# Patient Record
Sex: Male | Born: 1975 | Race: White | Hispanic: No | Marital: Single | Smoking: Never smoker
Health system: Southern US, Community
[De-identification: ages and names within clinical notes are randomized; demographics above are authoritative.]

## PROBLEM LIST (undated history)

## (undated) DIAGNOSIS — G4700 Insomnia, unspecified: Secondary | ICD-10-CM

## (undated) DIAGNOSIS — F419 Anxiety disorder, unspecified: Secondary | ICD-10-CM

## (undated) HISTORY — DX: Anxiety disorder, unspecified: F41.9

## (undated) HISTORY — DX: Insomnia, unspecified: G47.00

## (undated) HISTORY — PX: OTHER SURGICAL HISTORY: SHX169

---

## 2014-12-04 ENCOUNTER — Ambulatory Visit: Payer: Self-pay | Admitting: Family Medicine

## 2014-12-04 LAB — CBC AND DIFFERENTIAL
HCT: 47 % (ref 41–53)
HEMOGLOBIN: 16 g/dL (ref 13.5–17.5)
Platelets: 173 10*3/uL (ref 150–399)
WBC: 5 10^3/mL

## 2014-12-04 LAB — BASIC METABOLIC PANEL
BUN: 13 mg/dL (ref 4–21)
Creatinine: 1 mg/dL (ref 0.6–1.3)
Glucose: 107 mg/dL
Potassium: 4.4 mmol/L (ref 3.4–5.3)
SODIUM: 144 mmol/L (ref 137–147)

## 2014-12-04 LAB — HEPATIC FUNCTION PANEL
ALT: 18 U/L (ref 10–40)
AST: 14 U/L (ref 14–40)

## 2014-12-04 LAB — LIPID PANEL
Cholesterol: 202 mg/dL — AB (ref 0–200)
HDL: 70 mg/dL (ref 35–70)
LDL CALC: 121 mg/dL
TRIGLYCERIDES: 55 mg/dL (ref 40–160)

## 2015-11-04 DIAGNOSIS — G4700 Insomnia, unspecified: Secondary | ICD-10-CM | POA: Insufficient documentation

## 2015-11-04 DIAGNOSIS — F439 Reaction to severe stress, unspecified: Secondary | ICD-10-CM | POA: Insufficient documentation

## 2015-11-04 DIAGNOSIS — F419 Anxiety disorder, unspecified: Secondary | ICD-10-CM | POA: Insufficient documentation

## 2015-11-05 ENCOUNTER — Encounter: Payer: Self-pay | Admitting: Family Medicine

## 2015-11-05 ENCOUNTER — Ambulatory Visit (INDEPENDENT_AMBULATORY_CARE_PROVIDER_SITE_OTHER): Payer: No Typology Code available for payment source | Admitting: Family Medicine

## 2015-11-05 VITALS — BP 104/60 | HR 81 | Temp 98.2°F | Resp 16 | Wt 158.0 lb

## 2015-11-05 DIAGNOSIS — R1011 Right upper quadrant pain: Secondary | ICD-10-CM | POA: Insufficient documentation

## 2015-11-05 LAB — POCT URINALYSIS DIPSTICK
GLUCOSE UA: NEGATIVE
Ketones, UA: NEGATIVE
Leukocytes, UA: NEGATIVE
Nitrite, UA: NEGATIVE
Protein, UA: NEGATIVE
RBC UA: NEGATIVE
SPEC GRAV UA: 1.02
Urobilinogen, UA: 0.2
pH, UA: 6

## 2015-11-05 NOTE — Progress Notes (Signed)
Patient: Douglas Yang Male    DOB: 1976/02/21   40 y.o.   MRN: [id number] Visit Date: 11/05/2015  Today's Provider: Lelon Huh, MD   Chief Complaint  Patient presents with  . Abdominal Pain  . Back Pain   Subjective:    Abdominal Pain This is a recurrent problem. The current episode started more than 1 month ago (1 month). The onset quality is gradual. The problem occurs intermittently. The problem has been gradually improving. The pain is located in the RUQ and right flank. The pain is at a severity of 5/10. The pain is moderate. The quality of the pain is aching (presssure ). The abdominal pain does not radiate. Associated symptoms include belching, flatus, melena and myalgias. Pertinent negatives include no anorexia, arthralgias, constipation, diarrhea, dysuria, fever, frequency, headaches, hematochezia, hematuria, nausea, vomiting or weight loss. Associated symptoms comments: Ran a fever last week. The pain is aggravated by eating. The pain is relieved by palpation (rest). He has tried antacids (zantac) for the symptoms. The treatment provided mild relief.    Upper right quadrant pain, pressure, discomfort for the last month. Low back pain on right side. Patient was seen in office 11/2014 for similar symptoms. He had normal CBC and Met C. He had abdominal Xr showing mild excess of stool. Pain spontaneously resolved and did not come back until about 6 weeks ago. It feels better as soon as he lays down at night. No nausea. It is a steady pain.    No Known Allergies Previous Medications   GLUCOSAMINE HCL (GLUCOSAMINE PO)    Take by mouth daily.   RANITIDINE HCL (ZANTAC PO)    Take by mouth daily.    Review of Systems  Constitutional: Negative for fever, chills, weight loss and appetite change.  Respiratory: Negative for chest tightness, shortness of breath and wheezing.   Cardiovascular: Negative for palpitations.  Gastrointestinal: Positive for abdominal pain,  melena and flatus. Negative for nausea, vomiting, diarrhea, constipation, hematochezia and anorexia.  Genitourinary: Negative for dysuria, frequency and hematuria.  Musculoskeletal: Positive for myalgias and back pain. Negative for arthralgias.  Neurological: Negative for headaches.    Social History  Substance Use Topics  . Smoking status: Never Smoker   . Smokeless tobacco: Not on file  . Alcohol Use: 0.0 oz/week    0 Standard drinks or equivalent per week     Comment: occasional use   Objective:   BP 104/60 mmHg  Pulse 81  Temp(Src) 98.2 F (36.8 C) (Oral)  Resp 16  Wt 158 lb (71.668 kg)  SpO2 99%  Physical Exam  General Appearance:    Alert, cooperative, no distress  Eyes:    PERRL, conjunctiva/corneas clear, EOM's intact       Lungs:     Clear to auscultation bilaterally, respirations unlabored  Heart:    Regular rate and rhythm  Abdomen:   bowel sounds present and normal in all 4 quadrants, soft, round, non-distended. Slight RUQ tenderness. Negative Carnett's sign. No CVA tenderness   Results for orders placed or performed in visit on 11/05/15  POCT urinalysis dipstick  Result Value Ref Range   Color, UA Yellow    Clarity, UA Clear    Glucose, UA Neg    Bilirubin, UA Small    Ketones, UA Neg    Spec Grav, UA 1.020    Blood, UA Neg    pH, UA 6.0    Protein, UA Neg    Urobilinogen,  UA 0.2    Nitrite, UA Neg    Leukocytes, UA Negative Negative        Assessment & Plan:     1. Right upper quadrant pain Recurrent. Normal CBC and met C last year. Obtain ultrasound, if negative consider CT - US Abdomen Limited RUQ; Future - POCT urinalysis dipstick       Lelon Huh, MD  [facility]

## 2015-11-08 ENCOUNTER — Telehealth: Payer: Self-pay | Admitting: Family Medicine

## 2015-11-08 ENCOUNTER — Ambulatory Visit
Admission: RE | Admit: 2015-11-08 | Discharge: 2015-11-08 | Disposition: A | Discharge status: Home / Self Care | Payer: No Typology Code available for payment source | Source: Ambulatory Visit | Attending: Family Medicine | Admitting: Family Medicine

## 2015-11-08 DIAGNOSIS — R1011 Right upper quadrant pain: Secondary | ICD-10-CM | POA: Diagnosis not present

## 2015-11-08 DIAGNOSIS — R109 Unspecified abdominal pain: Secondary | ICD-10-CM

## 2015-11-08 NOTE — Telephone Encounter (Signed)
-----   Message from Malva Limes, MD sent at 11/08/2015 10:14 AM EST ----- Ultrasound is completely normal. Need to proceed with CT abdomen with contrast  for persistent abdominal pain.

## 2015-11-08 NOTE — Telephone Encounter (Signed)
Pt stated he was returning a nurse call. Thanks TNP °

## 2015-11-08 NOTE — Telephone Encounter (Signed)
Please schedule CT abdominal scan. Thanks!

## 2015-11-13 ENCOUNTER — Ambulatory Visit
Admission: RE | Admit: 2015-11-13 | Discharge: 2015-11-13 | Disposition: A | Discharge status: Home / Self Care | Payer: No Typology Code available for payment source | Source: Ambulatory Visit | Attending: Family Medicine | Admitting: Family Medicine

## 2015-11-13 DIAGNOSIS — R109 Unspecified abdominal pain: Secondary | ICD-10-CM | POA: Diagnosis present

## 2015-11-13 DIAGNOSIS — K760 Fatty (change of) liver, not elsewhere classified: Secondary | ICD-10-CM | POA: Insufficient documentation

## 2015-11-13 DIAGNOSIS — D734 Cyst of spleen: Secondary | ICD-10-CM | POA: Insufficient documentation

## 2015-11-13 MED ORDER — IOHEXOL 300 MG/ML  SOLN
100.0000 mL | Freq: Once | INTRAMUSCULAR | Status: AC | PRN
  Administered 2015-11-13: 100 mL via INTRAVENOUS

## 2015-11-14 ENCOUNTER — Telehealth: Payer: Self-pay | Admitting: *Deleted

## 2015-11-14 DIAGNOSIS — R1011 Right upper quadrant pain: Secondary | ICD-10-CM

## 2015-11-14 NOTE — Telephone Encounter (Signed)
Patient was notified of results. Patient expressed understanding. Patient is still having discomfort. Referral for gastro entered.

## 2015-11-14 NOTE — Telephone Encounter (Signed)
-----   Message from Malva Limes, MD sent at 11/14/2015  8:42 AM EST ----- CT scan shows mild fatty infiltration of liver, which is not a health concern and should not be causing any pain. If pain is still there then I would recommend referral to a gastroenterologist for further evaluation.

## 2015-11-14 NOTE — Telephone Encounter (Signed)
Please schedule gastro appt. Thanks!

## 2016-05-22 ENCOUNTER — Ambulatory Visit: Payer: Self-pay | Admitting: Family Medicine

## 2017-05-13 ENCOUNTER — Ambulatory Visit (INDEPENDENT_AMBULATORY_CARE_PROVIDER_SITE_OTHER): Payer: 59 | Admitting: Family Medicine

## 2017-05-13 ENCOUNTER — Encounter: Payer: Self-pay | Admitting: Family Medicine

## 2017-05-13 VITALS — BP 124/90 | HR 86 | Temp 98.2°F | Resp 16 | Wt 154.2 lb

## 2017-05-13 DIAGNOSIS — B349 Viral infection, unspecified: Secondary | ICD-10-CM | POA: Diagnosis not present

## 2017-05-13 NOTE — Patient Instructions (Signed)
We will call you with the lab results. 

## 2017-05-13 NOTE — Progress Notes (Signed)
Subjective:     Patient ID: Douglas Yang, male   DOB: 04-22-1976, 41 y.o.   MRN: [id number]  HPI  Chief Complaint  Patient presents with  . Lymphadenopathy    Patient comes in office today with concerns of swelling of his right lymph node for over two weeks. Patient states that he has had intermittent symptoms of sore throat, fatigue, sneezing and had har more of a mucuous production. Patient reports coworker had similar symptoms, he states that he has taken Ibuprofen for pain.   Rleports intermittent low grade viral sx for approx.two weeks. No fever, chills, or sinus congestion. Denies significant hx of allergies. Reports co-worker had similar sx. Admits to some fatigue and stress as he is trying to prepare his home for sale. Currently works in a [employer] dept.   Review of Systems     Objective:   Physical Exam  Constitutional: He appears well-developed and well-nourished. No distress.  Ears: T.M's intact without inflammation Throat: no tonsillar enlargement or exudate Neck: small left anterior cervical node present Lungs: clear     Assessment:    1. Nonspecific syndrome suggestive of viral illness - CBC with Differential/Platelet - Epstein-Barr virus VCA antibody panel    Plan:    Further f/u pending lab work.

## 2017-05-14 ENCOUNTER — Telehealth: Payer: Self-pay

## 2017-05-14 NOTE — Telephone Encounter (Signed)
Patient advised.KW 

## 2017-05-14 NOTE — Telephone Encounter (Signed)
-----   Message from Anola Gurneyobert Chauvin, [place] sent at 05/14/2017  7:47 AM EDT ----- Blood count is ok with no anemia or white count elevation suggesting a serious infection. We will call you when the mono antibody results come in-probably next week.

## 2017-05-15 LAB — EPSTEIN-BARR VIRUS VCA ANTIBODY PANEL
EBV EARLY ANTIGEN AB, IGG: 45.6 U/mL — AB (ref 0.0–8.9)
EBV NA IGG: 171 U/mL — AB (ref 0.0–17.9)
EBV VCA IgG: 287 U/mL — ABNORMAL HIGH (ref 0.0–17.9)

## 2017-05-15 LAB — CBC WITH DIFFERENTIAL/PLATELET
Basophils Absolute: 0 10*3/uL (ref 0.0–0.2)
Basos: 1 %
EOS (ABSOLUTE): 0.1 10*3/uL (ref 0.0–0.4)
Eos: 2 %
HEMOGLOBIN: 15.2 g/dL (ref 13.0–17.7)
Hematocrit: 44.5 % (ref 37.5–51.0)
IMMATURE GRANS (ABS): 0 10*3/uL (ref 0.0–0.1)
Immature Granulocytes: 0 %
LYMPHS: 27 %
Lymphocytes Absolute: 1.5 10*3/uL (ref 0.7–3.1)
MCH: 29.4 pg (ref 26.6–33.0)
MCHC: 34.2 g/dL (ref 31.5–35.7)
MCV: 86 fL (ref 79–97)
MONOCYTES: 5 %
Monocytes Absolute: 0.3 10*3/uL (ref 0.1–0.9)
NEUTROS ABS: 3.5 10*3/uL (ref 1.4–7.0)
Neutrophils: 65 %
PLATELETS: 179 10*3/uL (ref 150–379)
RBC: 5.17 x10E6/uL (ref 4.14–5.80)
RDW: 14.2 % (ref 12.3–15.4)
WBC: 5.3 10*3/uL (ref 3.4–10.8)

## 2017-05-18 ENCOUNTER — Telehealth: Payer: Self-pay

## 2017-05-18 NOTE — Telephone Encounter (Signed)
-----   Message from Anola Gurneyobert Chauvin, [place] sent at 05/18/2017  7:42 AM EDT ----- Your mono labs suggest that you have been exposed to mono in the past or are convalescing from a recent infection which is what I suspect. Try to increase rest and minimize stress if possible. Your symptoms should improve over the next two weeks or so.

## 2017-05-18 NOTE — Telephone Encounter (Signed)
Pt advised.  He reports he started feeling a little better over the weekend.   Thanks,   -Douglas RiegerLaura

## 2017-11-03 ENCOUNTER — Ambulatory Visit (INDEPENDENT_AMBULATORY_CARE_PROVIDER_SITE_OTHER): Payer: 59 | Admitting: Family Medicine

## 2017-11-03 ENCOUNTER — Encounter: Payer: Self-pay | Admitting: Family Medicine

## 2017-11-03 VITALS — BP 130/80 | HR 74 | Temp 98.7°F | Resp 16 | Wt 168.0 lb

## 2017-11-03 DIAGNOSIS — R05 Cough: Secondary | ICD-10-CM | POA: Diagnosis not present

## 2017-11-03 DIAGNOSIS — J069 Acute upper respiratory infection, unspecified: Secondary | ICD-10-CM

## 2017-11-03 DIAGNOSIS — R059 Cough, unspecified: Secondary | ICD-10-CM

## 2017-11-03 MED ORDER — HYDROCODONE-HOMATROPINE 5-1.5 MG/5ML PO SYRP: 5.0000 mL | ORAL_SOLUTION | Freq: Three times a day (TID) | ORAL | 0 refills | Status: AC | PRN

## 2017-11-03 MED ORDER — FLUTICASONE PROPIONATE 50 MCG/ACT NA SUSP: 2.0000 | Freq: Every day | NASAL | 6 refills | Status: AC

## 2017-11-03 NOTE — Progress Notes (Signed)
       Patient: Douglas Yang Male    DOB: 02/20/1976   42 y.o.   MRN: [id number] Visit Date: 11/03/2017  Today's Provider: Mila Merryonald Fisher, MD   Chief Complaint  Patient presents with  . Cough   Subjective:    Patient has had a dry cough and nasal congestion for 1 week. Symptoms include: irritated throat, non-productive cough, sinus pressure. Also has eye irritation, eye drainage, and photophobia. Patient has been taking otc Theraflu and delsym with mild relief.      Cough  This is a new problem. The current episode started in the past 7 days. The problem has been unchanged. The problem occurs constantly. The cough is non-productive. Associated symptoms include ear congestion, eye redness, nasal congestion, postnasal drip, rhinorrhea and a sore throat. Pertinent negatives include no chest pain, chills, ear pain, fever, headaches, heartburn, hemoptysis, myalgias, rash, shortness of breath, sweats, weight loss or wheezing. Nothing aggravates the symptoms. The treatment provided mild relief.       No Known Allergies   Current Outpatient Medications:  Marland Kitchen.  Glucosamine HCl (GLUCOSAMINE PO), Take by mouth daily., Disp: , Rfl:   Review of Systems  Constitutional: Negative for appetite change, chills, fever and weight loss.  HENT: Positive for congestion, postnasal drip, rhinorrhea, sinus pressure and sore throat. Negative for ear pain and sinus pain.   Eyes: Positive for photophobia, discharge, redness and itching.  Respiratory: Positive for cough. Negative for hemoptysis, chest tightness, shortness of breath and wheezing.   Cardiovascular: Negative for chest pain and palpitations.  Gastrointestinal: Negative for abdominal pain, heartburn, nausea and vomiting.  Musculoskeletal: Negative for myalgias.  Skin: Negative for rash.  Neurological: Negative for headaches.    Social History   Tobacco Use  . Smoking status: Never Smoker  . Smokeless tobacco: Never Used  Substance  Use Topics  . Alcohol use: Yes    Alcohol/week: 0.0 oz    Comment: occasional use   Objective:   BP 130/80 (BP Location: Right Arm, Patient Position: Sitting, Cuff Size: Normal)   Pulse 74   Temp 98.7 F (37.1 C) (Oral)   Resp 16   Wt 168 lb (76.2 kg)   SpO2 98%   BMI 23.43 kg/m     Physical Exam  General Appearance:    Alert, cooperative, no distress  HENT:   generalized TM normal without fluid or infection, neck without nodes, throat normal without erythema or exudate, sinuses nontender and nasal mucosa congested  Eyes:    PERRL, conjunctiva/corneas clear, EOM's intact       Lungs:     Clear to auscultation bilaterally, respirations unlabored  Heart:    Regular rate and rhythm  Neurologic:   Awake, alert, oriented x 3. No apparent focal neurological           defect.           Assessment & Plan:     1. Upper respiratory tract infection, unspecified type  - fluticasone (FLONASE) 50 MCG/ACT nasal spray; Place 2 sprays into both nostrils daily.  Dispense: 16 g; Refill: 6  2. Cough  - HYDROcodone-homatropine (HYCODAN) 5-1.5 MG/5ML syrup; Take 5 mLs by mouth every 8 (eight) hours as needed.  Dispense: 100 mL; Refill: 0  Call if symptoms change or if not rapidly improving.          Mila Merryonald Fisher, MD  [facility]

## 2017-11-27 IMAGING — CT CT ABD-PELV W/ CM
1 of 2 series · 15 of 32 positions shown, 19 images · IV contrast (APPLIED)
Comparison: None.

CLINICAL DATA: Recurrent right-sided abdominal pain for over 2
months.

EXAM:
CT ABDOMEN AND PELVIS WITH CONTRAST
TECHNIQUE: Multidetector CT imaging of the abdomen and pelvis was performed
using the standard protocol following bolus administration of
intravenous contrast.
CONTRAST:  100mL OMNIPAQUE IOHEXOL 300 MG/ML  SOLN

[Series 2: axial st · axial · 0.63mm/px · z∈[-1071,-656]mm · 15 of 91 slices shown, 19 images]
[im 4/91  soft-tissue]
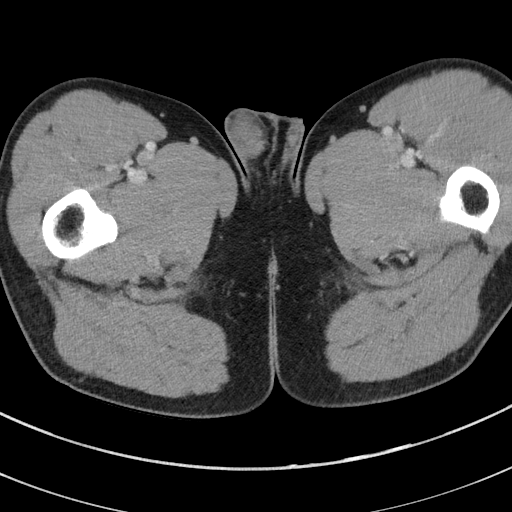
[im 4/91  bone]
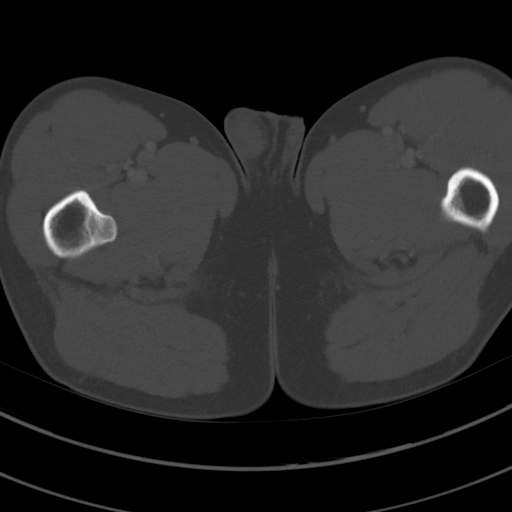
[im 11/91  soft-tissue]
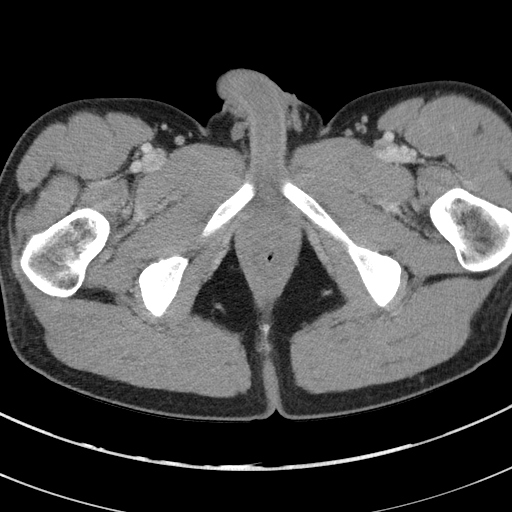
[im 19/91  soft-tissue]
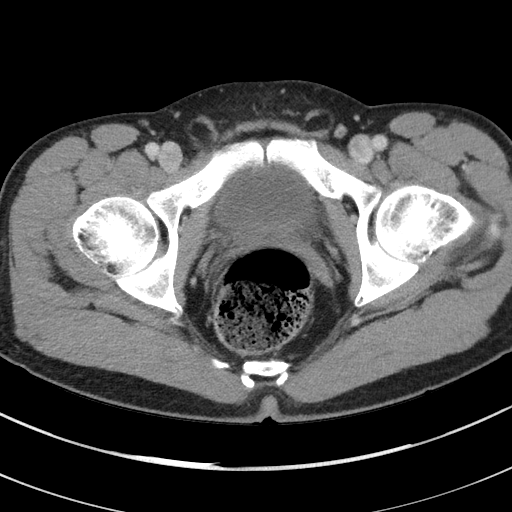
[im 26/91  soft-tissue]
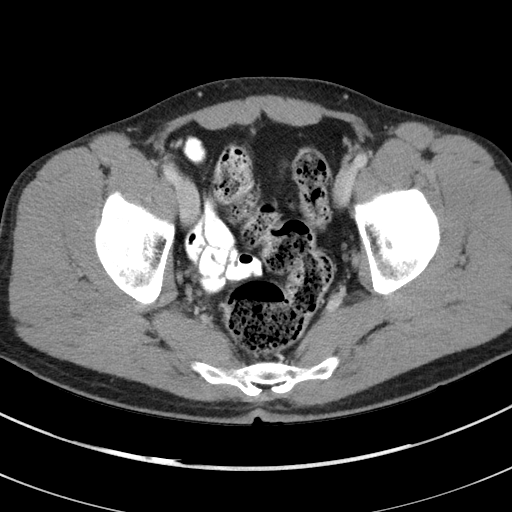
[im 33/91  soft-tissue]
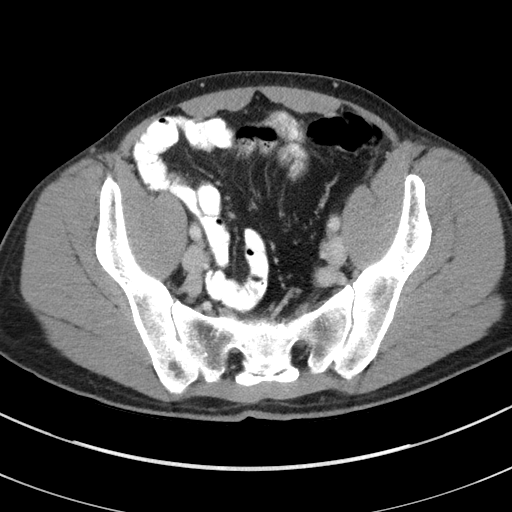
[im 40/91  soft-tissue]
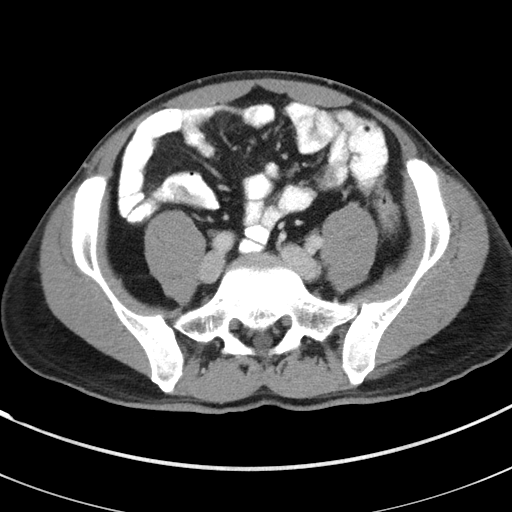
[im 47/91  soft-tissue]
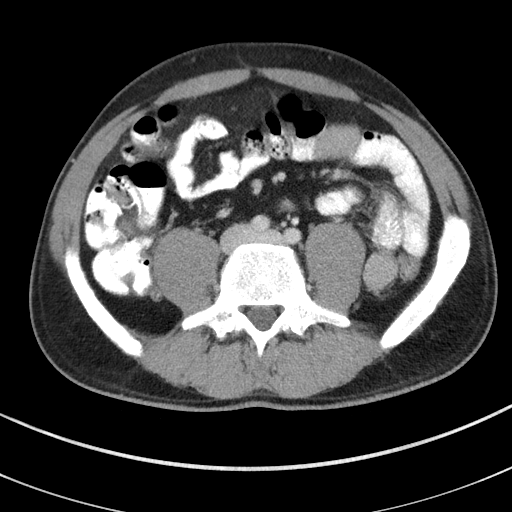
[im 51/91  soft-tissue]
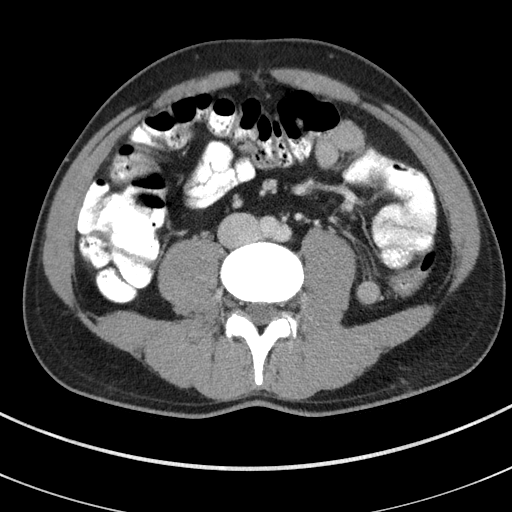
[im 58/91  soft-tissue]
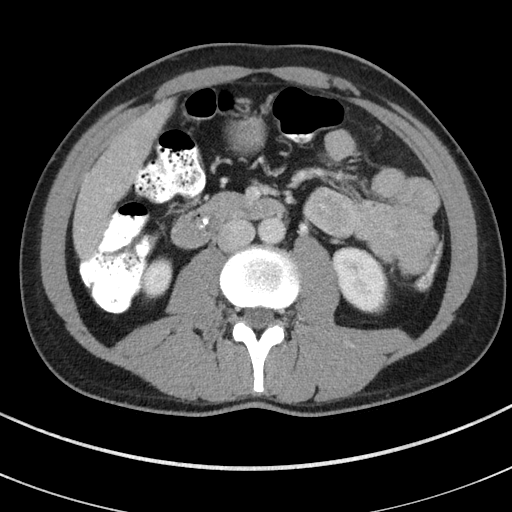
[im 58/91  bone]
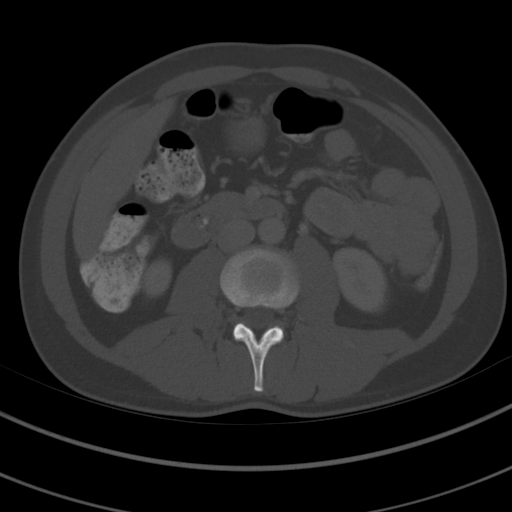
[im 65/91  soft-tissue]
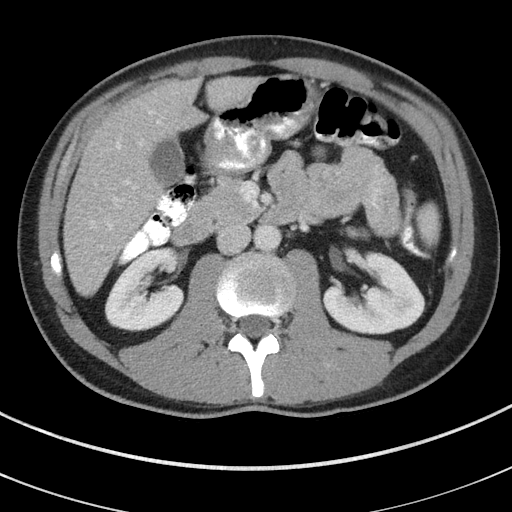
[im 73/91  soft-tissue]
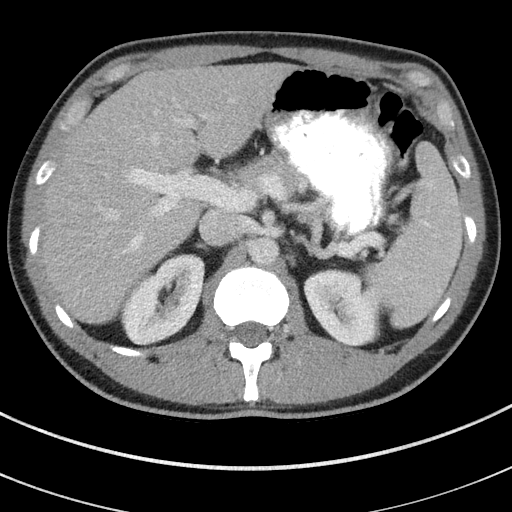
[im 76/91  lung]
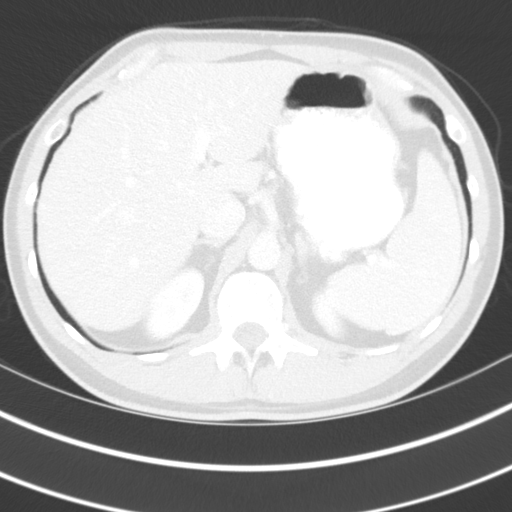
[im 80/91  soft-tissue]
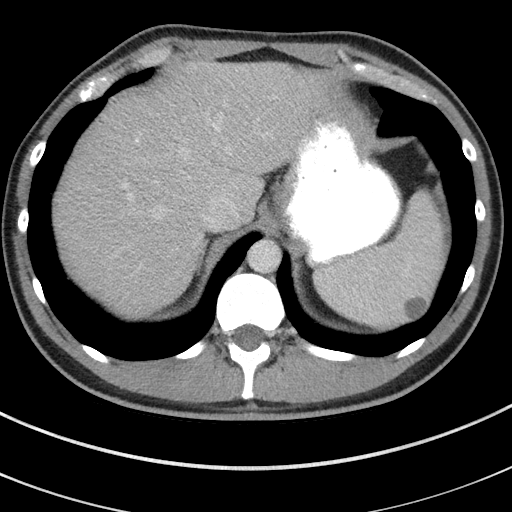
[im 80/91  lung]
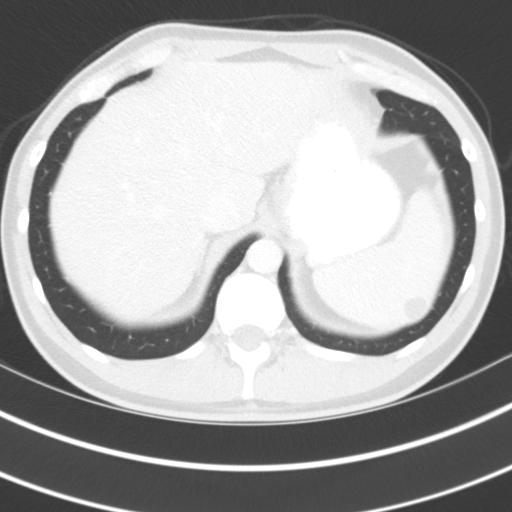
[im 83/91  lung]
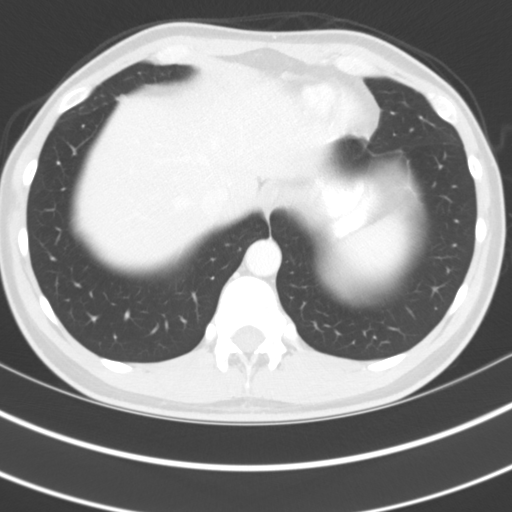
[im 87/91  soft-tissue]
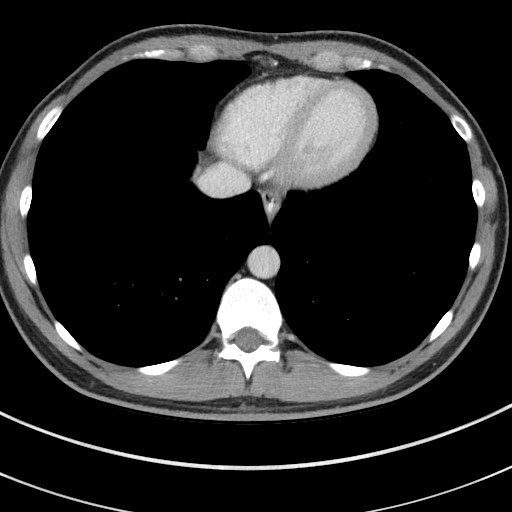
[im 87/91  lung]
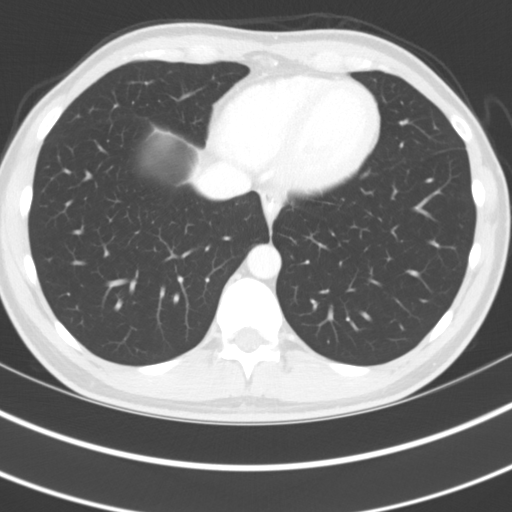

[15 of 32 positions shown; findings below may reference images not displayed]

FINDINGS: Lower chest:  No acute findings.

Hepatobiliary: Mild diffuse hepatic steatosis noted. No liver masses
are identified. Gallbladder is unremarkable. No evidence of biliary
ductal dilatation.

Pancreas: No mass, inflammatory changes, or other significant
abnormality.

Spleen: No evidence of splenomegaly. Benign-appearing cyst noted
within the spleen measuring 1.5 cm.

Adrenals/Urinary Tract: No masses identified. No evidence of
hydronephrosis.

Stomach/Bowel: No evidence of obstruction, inflammatory process, or
abnormal fluid collections. Normal appendix visualized.

Vascular/Lymphatic: No pathologically enlarged lymph nodes. No
evidence of abdominal aortic aneurysm.

Reproductive: No mass or other significant abnormality.

Other: None.

Musculoskeletal:  No suspicious bone lesions identified.
IMPRESSION: No acute findings identified within the abdomen or pelvis.

Incidental findings including mild hepatic steatosis and small
benign-appearing splenic cyst.

## 2018-08-17 ENCOUNTER — Ambulatory Visit (INDEPENDENT_AMBULATORY_CARE_PROVIDER_SITE_OTHER): Payer: 59 | Admitting: Family Medicine

## 2018-08-17 ENCOUNTER — Encounter: Payer: Self-pay | Admitting: Family Medicine

## 2018-08-17 VITALS — BP 124/82 | HR 89 | Temp 99.1°F | Resp 16 | Wt 166.0 lb

## 2018-08-17 DIAGNOSIS — R509 Fever, unspecified: Secondary | ICD-10-CM | POA: Diagnosis not present

## 2018-08-17 DIAGNOSIS — B349 Viral infection, unspecified: Secondary | ICD-10-CM

## 2018-08-17 LAB — POCT INFLUENZA A/B
INFLUENZA B, POC: NEGATIVE
Influenza A, POC: NEGATIVE

## 2018-08-17 NOTE — Progress Notes (Signed)
  Subjective:     Patient ID: Douglas Yang, male   DOB: Jan 08, 1976, 42 y.o.   MRN: [id number] Chief Complaint  Patient presents with  . URI    Patient c omes in office today with concerns of cold like symptoms for the past 4 days. Patient reports sore throat, cough, nasal/chest congestion and a fever for the past 12hrs high of 101.7. Patient states that he has been alternating between Tylenol and Ibuprofen.    HPI States he has a 903 month old at home who is not ill. Girl friend has had the flu shot.                                       Review of Systems     Objective:   Physical Exam  Constitutional: He appears well-developed and well-nourished. He has a sickly appearance. No distress.  Ears: Right TM obscured by cerumen. Left TM intact without inflammation Throat: no tonsillar enlargement or exudate Neck: no cervical adenopathy Lungs: clear     Assessment:    1. Fever, unspecified fever cause - POCT Influenza A/B  2. Acute viral syndrome    Plan:    Discussed otc medication and infection control practices. Work excuse for 12/4-12/6/19.

## 2018-08-17 NOTE — Patient Instructions (Signed)
Discussed use of Mucinex D for congestion, Delsym for cough, and Benadryl for postnasal drainage 

## 2018-09-02 ENCOUNTER — Telehealth: Payer: Self-pay | Admitting: Family Medicine

## 2018-09-02 MED ORDER — AMOXICILLIN 500 MG PO CAPS: 1000.0000 mg | ORAL_CAPSULE | Freq: Three times a day (TID) | ORAL | 0 refills | Status: AC

## 2018-09-02 NOTE — Telephone Encounter (Signed)
Pt came in Dec 4th and wasn't given an antibiotic and was told to take over the counter medications. Pt is not over the cough and congestion in his chest.  He is wanting to know if something can be called in for him since he has been seen.  He doesn't want to miss anymore work.  Please advise.  Thanks, [employer]

## 2018-09-02 NOTE — Telephone Encounter (Signed)
Have sent prescription for [facility].

## 2018-09-02 NOTE — Telephone Encounter (Signed)
Advised patient as below.  

## 2019-12-23 ENCOUNTER — Ambulatory Visit: Payer: Self-pay | Attending: Internal Medicine

## 2019-12-23 DIAGNOSIS — Z23 Encounter for immunization: Secondary | ICD-10-CM

## 2019-12-23 NOTE — Progress Notes (Signed)
   Covid-19 Vaccination Clinic  Name:  Douglas Yang    MRN: [id number] DOB: [birth year]  12/23/2019  Douglas Yang was observed post Covid-19 immunization for 15 minutes without incident. He was provided with Vaccine Information Sheet and instruction to access the V-Safe system.   Douglas Yang was instructed to call 911 with any severe reactions post vaccine: Marland Kitchen Difficulty breathing  . Swelling of face and throat  . A fast heartbeat  . A bad rash all over body  . Dizziness and weakness   Immunizations Administered    Name Date Dose VIS Date Route   Pfizer COVID-19 Vaccine 12/23/2019  9:36 AM 0.3 mL 08/25/2019 Intramuscular   Manufacturer: [employer], [employer]   Lot: [id number]   NDC: [address]-6

## 2020-01-16 ENCOUNTER — Ambulatory Visit: Payer: Self-pay | Attending: Internal Medicine

## 2020-01-16 DIAGNOSIS — Z23 Encounter for immunization: Secondary | ICD-10-CM

## 2020-01-16 NOTE — Progress Notes (Signed)
   Covid-19 Vaccination Clinic  Name:  [name]    MRN: [id number] DOB: 01/17/1976  01/16/2020  Mr. Cho was observed post Covid-19 immunization for 15 minutes without incident. He was provided with Vaccine Information Sheet and instruction to access the V-Safe system.   Mr. Lonon was instructed to call 911 with any severe reactions post vaccine: Marland Kitchen Difficulty breathing  . Swelling of face and throat  . A fast heartbeat  . A bad rash all over body  . Dizziness and weakness   Immunizations Administered    Name Date Dose VIS Date Route   Pfizer COVID-19 Vaccine 01/16/2020 12:17 PM 0.3 mL 11/08/2018 Intramuscular   Manufacturer: [employer], [employer]   Lot: [id number]   NDC: [address]-3

## 2020-11-21 ENCOUNTER — Telehealth (INDEPENDENT_AMBULATORY_CARE_PROVIDER_SITE_OTHER): Payer: 59 | Admitting: Physician Assistant

## 2020-11-21 DIAGNOSIS — R0981 Nasal congestion: Secondary | ICD-10-CM | POA: Diagnosis not present

## 2020-11-21 MED ORDER — AMOXICILLIN 875 MG PO TABS: 875.0000 mg | ORAL_TABLET | Freq: Two times a day (BID) | ORAL | 0 refills | Status: AC

## 2020-11-21 NOTE — Progress Notes (Signed)
MyChart Video Visit    Virtual Visit via Video Note   This visit type was conducted due to national recommendations for restrictions regarding the COVID-19 Pandemic (e.g. social distancing) in an effort to limit this patient's exposure and mitigate transmission in our community. This patient is at least at moderate risk for complications without adequate follow up. This format is felt to be most appropriate for this patient at this time. Physical exam was limited by quality of the video and audio technology used for the visit.   Patient location: Home Provider location: Office   I discussed the limitations of evaluation and management by telemedicine and the availability of in person appointments. The patient expressed understanding and agreed to proceed.  Patient: Douglas Yang   DOB: 04/09/1976   45 y.o. Male  MRN: [id number] Visit Date: 11/21/2020  Today's healthcare provider: Trey Sailors, PA-C   Chief Complaint  Patient presents with  . URI  I,Jurnee Nakayama M Coriann Brouhard,acting as a scribe for [employer], PA-C.,have documented all relevant documentation on the behalf of Trey Sailors, PA-C,as directed by  Trey Sailors, PA-C while in the presence of Trey Sailors, PA-C.  Subjective    URI  This is a recurrent problem. The current episode started 1 to 4 weeks ago. The problem has been unchanged. There has been no fever. Associated symptoms include congestion, coughing, ear pain, rhinorrhea, sinus pain, sneezing and a sore throat. Pertinent negatives include no diarrhea, headaches, nausea, vomiting or wheezing. He has tried decongestant, acetaminophen and NSAIDs for the symptoms. The treatment provided mild relief.    Patient reports sinus pressure and congestion for 3-4 weeks, ear pain. Initially when he got sick he got COVID tested. Three to four weeks ago he started to feel congestion. He reports he has persistent congestion since then that has waxed and waned  to some extent. Some days he will have more nasal congestion and other days he will have a sore throat. No SOB. He has not tried anything for his symptoms recently. The first 1-2 weeks he took a decongestant several times at night to help him sleep. He has taken an ibuprofen to help reduce inflammation. Has not taken anything for the past couple of weeks. He reports he has congestion that concentrates on his right side.     Medications: Outpatient Medications Prior to Visit  Medication Sig  . Glucosamine HCl (GLUCOSAMINE PO) Take by mouth daily.  . fluticasone (FLONASE) 50 MCG/ACT nasal spray Place 2 sprays into both nostrils daily. (Patient not taking: No sig reported)   No facility-administered medications prior to visit.    Review of Systems  Constitutional: Negative for appetite change, chills, fatigue and fever.  HENT: Positive for congestion, ear pain, postnasal drip, rhinorrhea, sinus pressure, sinus pain, sneezing and sore throat.   Respiratory: Positive for cough. Negative for chest tightness, shortness of breath and wheezing.   Gastrointestinal: Negative for diarrhea, nausea and vomiting.  Neurological: Negative for weakness and headaches.      Objective    There were no vitals taken for this visit.   Physical Exam Constitutional:      Appearance: Normal appearance.  Pulmonary:     Effort: Pulmonary effort is normal. No respiratory distress.  Neurological:     Mental Status: He is alert.  Psychiatric:        Mood and Affect: Mood normal.        Behavior: Behavior normal.  Assessment & Plan    1. Sinus congestion  Discussed with patient that persistence of symptoms for 3-4 weeks and waxing/waning nature are most consistent with allergies. Advised to take daily 2nd gen antihistamine and flonase for at least one week before using an antibiotic as I do not think this is a bacterial sinus infection. May use antibiotic if not impoving.   - amoxicillin (AMOXIL)  875 MG tablet; Take 1 tablet (875 mg total) by mouth 2 (two) times daily for 7 days.  Dispense: 14 tablet; Refill: 0    Return if symptoms worsen or fail to improve.     I discussed the assessment and treatment plan with the patient. The patient was provided an opportunity to ask questions and all were answered. The patient agreed with the plan and demonstrated an understanding of the instructions.   The patient was advised to call back or seek an in-person evaluation if the symptoms worsen or if the condition fails to improve as anticipated.   ITrey Sailors, PA-C, have reviewed all documentation for this visit. The documentation on 11/21/20 for the exam, diagnosis, procedures, and orders are all accurate and complete.  The entirety of the information documented in the History of Present Illness, Review of Systems and Physical Exam were personally obtained by me. Portions of this information were initially documented by [facility] and reviewed by me for thoroughness and accuracy.    Maryella Shivers [facility] [phone] (phone) [phone] (fax)  [facility]

## 2020-11-21 NOTE — Patient Instructions (Signed)
Allergies, Adult An allergy means that your body reacts to something that bothers it (allergen). This can happen from something that you eat, breathe in, or touch. Allergies often affect the nose, eyes, skin, and stomach. They can be mild, moderate, or very bad (severe). An allergy cannot spread from person to person. They can happen at any age. Sometimes, people outgrow them. What are the causes?  Outdoor things, such as pollen, car fumes, and mold.  Indoor things, such as dust, smoke, mold, and pets.  Foods.  Medicines.  Things that bother your skin, such as perfume and bug bites. What increases the risk?  Having family members with allergies or asthma. What are the signs or symptoms? Symptoms depend on how bad your allergy is. Mild to moderate symptoms  Runny nose, stuffy nose, or sneezing.  Itchy mouth, ears, or throat.  A feeling of mucus dripping down the back of your throat.  Sore throat.  Eyes that are itchy, red, watery, or puffy.  A skin rash, or red, swollen areas of skin (hives).  Stomach cramps or bloating. Severe symptoms Very bad allergies to food, medicine, or bug bites may cause a very bad allergy reaction (anaphylaxis). This can be life-threatening. Symptoms include:  A red face.  Wheezing or coughing.  Swollen lips, tongue, or mouth.  Tight or swollen throat.  Chest pain or tightness, or a fast heartbeat.  Trouble breathing or shortness of breath.  Pain in your belly (abdomen), vomiting, or watery poop (diarrhea).  Feeling dizzy or fainting. How is this treated? Treatment for this condition depends on your symptoms. Treatment may include:  Cold, wet cloths for itching and swelling.  Eye drops, nose sprays, or skin creams.  Washing out your nose each day.  A humidifier.  Medicines.  A change to the foods you eat.  Being exposed again and again to tiny amounts of allergens. This helps your body get used to them. You might  have: ? Allergy shots. ? Very small amounts of allergen put under your tongue.  An emergency shot (auto-injector pen) if you have a very bad allergy reaction. ? This is a medicine with a needle. You can put it into your skin by yourself. ? Your doctor will teach you how to use it.      Follow these instructions at home: Medicines  Take or apply over-the-counter and prescription medicines only as told by your doctor.  If you are at risk for a very bad allergy reaction, keep an auto-injector pen with you all the time.   Eating and drinking  Follow instructions from your doctor about what to eat and drink.  Drink enough fluid to keep your pee (urine) pale yellow. General instructions  If you have ever had a very bad allergy reaction, wear a medical alert bracelet or necklace.  Stay away from things that you are allergic to.  Keep all follow-up visits as told by your doctor. This is important. Contact a doctor if:  Your symptoms do not get better with treatment. Get help right away if:  You have symptoms of a very bad allergy reaction. These include: ? A swollen mouth, tongue, or throat. ? Pain or tightness in your chest. ? Trouble breathing. ? Being short of breath. ? Dizziness. ? Fainting. ? Very bad pain in your belly. ? Vomiting. ? Watery poop. These symptoms may be an emergency. Do not wait to see if the symptoms will go away. Get medical help right away. Call your local   emergency services (911 in the [place] Do not drive yourself to the hospital. Summary  Take or apply over-the-counter and prescription medicines only as told by your doctor.  Stay away from things you are allergic to.  If you are at risk for a very bad allergy reaction, carry an auto-injector pen all the time.  Wear a medical alert bracelet or necklace.  Very bad allergy reactions can be life-threatening. Get help right away. This information is not intended to replace advice given to you by your  health care provider. Make sure you discuss any questions you have with your health care provider. Document Revised: 07/12/2019 Document Reviewed: 07/12/2019 Elsevier Patient Education  2021 Elsevier Inc.  

## 2021-07-24 ENCOUNTER — Ambulatory Visit: Payer: Self-pay | Admitting: *Deleted

## 2021-07-24 NOTE — Telephone Encounter (Signed)
Unfortunately, needs to wait for appointment, ive never seen him and hes not a regular here

## 2021-07-24 NOTE — Telephone Encounter (Signed)
Requesting Tamiflu instead of waiting for appointment tomorrow. Daughters positive for Type A flu yesterday. Today, he wakes up with ST, fatigue and mild body aches, no fever and does not want to wait until 1st available appointment tomorrow (already scheduled).Care advice including increase water intake for hydration and OTC pain relief like tylenol.  [facility] [address] Routing to physician for review.    Reason for Disposition  Caller has medicine question, adult has minor symptoms, caller declines triage, AND triager answers question  Answer Assessment - Initial Assessment Questions 1. NAME of MEDICATION: "What medicine are you calling about?"     Tamiflu 2. QUESTION: "What is your question?" (e.g., double dose of medicine, side effect)     Requesting Tamiflu 3. PRESCRIBING HCP: "Who prescribed it?" Reason: if prescribed by specialist, call should be referred to that group.     na 4. SYMPTOMS: "Do you have any symptoms?"     ST, fatigue, body aches 5. SEVERITY: If symptoms are present, ask "Are they mild, moderate or severe?"     Mild today 6. PREGNANCY:  "Is there any chance that you are pregnant?" "When was your last menstrual period?"     na  Protocols used: Medication Question Call-A-AH

## 2021-07-25 ENCOUNTER — Encounter: Payer: Self-pay | Admitting: Family Medicine

## 2021-07-25 ENCOUNTER — Other Ambulatory Visit: Payer: Self-pay

## 2021-07-25 ENCOUNTER — Ambulatory Visit (INDEPENDENT_AMBULATORY_CARE_PROVIDER_SITE_OTHER): Payer: 59 | Admitting: Family Medicine

## 2021-07-25 VITALS — BP 121/80 | HR 93 | Temp 98.9°F | Resp 16 | Wt 163.0 lb

## 2021-07-25 DIAGNOSIS — Z20828 Contact with and (suspected) exposure to other viral communicable diseases: Secondary | ICD-10-CM

## 2021-07-25 DIAGNOSIS — J069 Acute upper respiratory infection, unspecified: Secondary | ICD-10-CM | POA: Diagnosis not present

## 2021-07-25 MED ORDER — OSELTAMIVIR PHOSPHATE 75 MG PO CAPS: 75.0000 mg | ORAL_CAPSULE | Freq: Two times a day (BID) | ORAL | 0 refills | Status: AC

## 2021-07-25 NOTE — Progress Notes (Signed)
I,April Miller,acting as a scribe for Shirlee Latch, MD.,have documented all relevant documentation on the behalf of Shirlee Latch, MD,as directed by  Shirlee Latch, MD while in the presence of Shirlee Latch, MD.   Established patient visit   Patient: Douglas Yang   DOB: [birth year]   45 y.o. Male  MRN: [id number] Visit Date: 07/25/2021  Today's healthcare provider: Shirlee Latch, MD   Chief Complaint  Patient presents with   Cough   Sore Throat   Subjective    Cough This is a new problem. The current episode started yesterday. The problem has been gradually worsening. The problem occurs constantly. The cough is Non-productive. Associated symptoms include chills, a fever, nasal congestion, rhinorrhea and a sore throat. Pertinent negatives include no chest pain, ear congestion, ear pain, headaches, heartburn, hemoptysis, myalgias, postnasal drip, rash, shortness of breath, sweats, weight loss or wheezing. Nothing aggravates the symptoms. Treatments tried: Ibuprofen. The treatment provided mild relief.    Patient has been exposed to flu this week by his daughter. Patient began having symptoms yesterday morning. Patient has cough, congestion, scratchy throat, and low-grade fever (100), malaise, myalgias. Patient has been taking ibuprofen for symptoms, with mild relief.  X1d Hasn't had a flu vaccine this year.    Medications: Outpatient Medications Prior to Visit  Medication Sig   fluticasone (FLONASE) 50 MCG/ACT nasal spray Place 2 sprays into both nostrils daily. (Patient not taking: No sig reported)   Glucosamine HCl (GLUCOSAMINE PO) Take by mouth daily.   No facility-administered medications prior to visit.    Review of Systems  Constitutional:  Positive for chills and fever. Negative for appetite change and weight loss.  HENT:  Positive for congestion, rhinorrhea and sore throat. Negative for ear pain and postnasal drip.   Respiratory:   Positive for cough. Negative for hemoptysis, chest tightness, shortness of breath and wheezing.   Cardiovascular:  Negative for chest pain and palpitations.  Gastrointestinal:  Negative for abdominal pain, heartburn, nausea and vomiting.  Musculoskeletal:  Negative for myalgias.  Skin:  Negative for rash.  Neurological:  Negative for headaches.      Objective    BP 121/80 (BP Location: Right Arm, Patient Position: Sitting, Cuff Size: Normal)   Pulse 93   Temp 98.9 F (37.2 C) (Temporal)   Resp 16   Wt 163 lb (73.9 kg)   SpO2 96%   BMI 22.73 kg/m  {Show previous vital signs (optional):23777}  Physical Exam Vitals reviewed.  Constitutional:      General: He is not in acute distress.    Appearance: Normal appearance. He is not diaphoretic.  HENT:     Head: Normocephalic and atraumatic.  Eyes:     General: No scleral icterus.    Conjunctiva/sclera: Conjunctivae normal.  Cardiovascular:     Rate and Rhythm: Normal rate and regular rhythm.     Pulses: Normal pulses.     Heart sounds: Normal heart sounds. No murmur heard. Pulmonary:     Effort: Pulmonary effort is normal. No respiratory distress.     Breath sounds: Normal breath sounds. No wheezing or rhonchi.  Abdominal:     General: There is no distension.     Palpations: Abdomen is soft.     Tenderness: There is no abdominal tenderness.  Musculoskeletal:     Cervical back: Neck supple.     Right lower leg: No edema.     Left lower leg: No edema.  Lymphadenopathy:     Cervical:  No cervical adenopathy.  Skin:    General: Skin is warm and dry.     Capillary Refill: Capillary refill takes less than 2 seconds.     Findings: No rash.  Neurological:     Mental Status: He is alert and oriented to person, place, and time.     Cranial Nerves: No cranial nerve deficit.  Psychiatric:        Mood and Affect: Mood normal.        Behavior: Behavior normal.      No results found for any visits on 07/25/21.  Assessment & Plan      1. Viral URI with cough 2. Exposure to the flu - symptoms and exam c/w viral URI - no evidence of strep pharyngitis, CAP, AOM, bacterial sinusitis, or other bacterial infection - concern for flu given known exposure - will start tamiflu, but send test for flu and COVID - additional treatment pending results - discussed symptomatic management, natural course, and return precautions  - Coronavirus (COVID-19) with Influenza A and Influenza B  Meds ordered this encounter  Medications   oseltamivir (TAMIFLU) 75 MG capsule    Sig: Take 1 capsule (75 mg total) by mouth 2 (two) times daily for 5 days.    Dispense:  10 capsule    Refill:  0     Return if symptoms worsen or fail to improve.      I, Shirlee Latch, MD, have reviewed all documentation for this visit. The documentation on 07/25/21 for the exam, diagnosis, procedures, and orders are all accurate and complete.   Tayt Moyers, Marzella Schlein, MD, MPH [facility]

## 2021-07-27 LAB — SPECIMEN STATUS REPORT

## 2021-07-27 LAB — COVID-19, FLU A+B NAA
Influenza A, NAA: DETECTED — AB
Influenza B, NAA: NOT DETECTED
SARS-CoV-2, NAA: NOT DETECTED

## 2021-09-11 ENCOUNTER — Ambulatory Visit: Payer: Self-pay | Admitting: *Deleted

## 2021-09-11 NOTE — Telephone Encounter (Signed)
Pt called saying he thinks he has a sinus infection and wants to be seen asap.   CB#  [phone]    Chief Complaint: Sinus drainage Symptoms: Greenish drainage "Hacked up", slight frontal headache, stuffy nose then runny "Tan, green when blow nose, one streak of blood."  Frequency: 1 1/2 weeks ago Pertinent Negatives: Patient denies fever, sob, wheezing,sore throat Disposition: [] ED /[] Urgent Care (no appt availability in office) / [] Appointment(In office/virtual)/ [x]  [facility]/ [] Home Care/ [] Refused Recommended Disposition  Additional Notes:     Reason for Disposition  [1] Sinus congestion (pressure, fullness) AND [2] present > 10 days  Answer Assessment - Initial Assessment Questions 1. LOCATION: "Where does it hurt?"      Mostly in nose and sinus cavities 2. ONSET: "When did the sinus pain start?"  (e.g., hours, days)      1 1/2 weeks ago 3. SEVERITY: "How bad is the pain?"   (Scale 1-10; mild, moderate or severe)   - MILD (1-3): doesn't interfere with normal activities    - MODERATE (4-7): interferes with normal activities (e.g., work or school) or awakens from sleep   - SEVERE (8-10): excruciating pain and patient unable to do any normal activities        mild 4. RECURRENT SYMPTOM: "Have you ever had sinus problems before?" If Yes, ask: "When was the last time?" and "What happened that time?"      yes 5. NASAL CONGESTION: "Is the nose blocked?" If Yes, ask: "Can you open it or must you breathe through your mouth?"     At times 6. NASAL DISCHARGE: "Do you have discharge from your nose?" If so ask, "What color?"     Greenish,tan, slight tinge of blood 7. FEVER: "Do you have a fever?" If Yes, ask: "What is it, how was it measured, and when did it start?"      no 8. OTHER SYMPTOMS: "Do you have any other symptoms?" (e.g., sore throat, cough, earache, difficulty breathing)     Mild sore throat from drainage. Mild cough, dry. Drainage down throat  Protocols  used: Sinus Pain or Congestion-A-AH

## 2022-06-19 ENCOUNTER — Ambulatory Visit: Payer: Self-pay | Admitting: *Deleted

## 2022-06-19 ENCOUNTER — Encounter: Payer: Self-pay | Admitting: Family Medicine

## 2022-06-19 ENCOUNTER — Ambulatory Visit: Payer: 59 | Admitting: Family Medicine

## 2022-06-19 VITALS — BP 137/101 | HR 85 | Temp 98.7°F | Ht 70.98 in | Wt 166.0 lb

## 2022-06-19 DIAGNOSIS — J40 Bronchitis, not specified as acute or chronic: Secondary | ICD-10-CM | POA: Diagnosis not present

## 2022-06-19 DIAGNOSIS — R059 Cough, unspecified: Secondary | ICD-10-CM | POA: Diagnosis not present

## 2022-06-19 DIAGNOSIS — R062 Wheezing: Secondary | ICD-10-CM

## 2022-06-19 MED ORDER — HYDROCODONE BIT-HOMATROP MBR 5-1.5 MG/5ML PO SOLN: 5.0000 mL | Freq: Three times a day (TID) | ORAL | 0 refills | Status: DC | PRN

## 2022-06-19 MED ORDER — ALBUTEROL SULFATE HFA 108 (90 BASE) MCG/ACT IN AERS: 2.0000 | INHALATION_SPRAY | Freq: Four times a day (QID) | RESPIRATORY_TRACT | 2 refills | Status: AC | PRN

## 2022-06-19 MED ORDER — AZITHROMYCIN 250 MG PO TABS: ORAL_TABLET | ORAL | 0 refills | Status: AC

## 2022-06-19 NOTE — Progress Notes (Signed)
Established patient visit   Patient: Douglas Yang   DOB: May 22, 1976   46 y.o. Male  MRN: [id number] Visit Date: 06/19/2022  Today's healthcare provider: Lelon Huh, MD   Chief Complaint  Patient presents with   Cough   Subjective    Cough This is a new problem. Episode onset: 1 week ago. The problem has been gradually improving (still has cough). Associated symptoms include a fever (resolved), myalgias and wheezing. Pertinent negatives include no chest pain, chills or shortness of breath. Treatments tried: Ibuprofen, Acetaminophen, Delsym and left over prescription cough medication. The treatment provided mild relief.  Had fevers up to 101 and body aches 2-3 into illness which lasted 2-3 days had have resolved. Coving has been productive clear and tan mucous the last couple of days. Gets a little winded with exertion.  Patient reports having a negative home COVID test 2 days ago. Having more head and sinus congestion the last day or so.   Medications: Outpatient Medications Prior to Visit  Medication Sig   fluticasone (FLONASE) 50 MCG/ACT nasal spray Place 2 sprays into both nostrils daily. (Patient not taking: Reported on 06/19/2022)   Glucosamine HCl (GLUCOSAMINE PO) Take by mouth daily. (Patient not taking: Reported on 06/19/2022)   No facility-administered medications prior to visit.    Review of Systems  Constitutional:  Positive for fever (resolved). Negative for appetite change and chills.  HENT:  Positive for congestion (chest and head congestion).   Respiratory:  Positive for cough (productive with tan colored sputum) and wheezing. Negative for chest tightness and shortness of breath.   Cardiovascular:  Negative for chest pain and palpitations.  Gastrointestinal:  Negative for abdominal pain, nausea and vomiting.  Musculoskeletal:  Positive for myalgias.       Objective    BP (!) 137/101 (BP Location: Left Arm, Patient Position: Sitting, Cuff Size:  Normal)   Pulse 85   Temp 98.7 F (37.1 C) (Oral)   Ht 5' 10.98" (1.803 m)   Wt 166 lb (75.3 kg)   SpO2 98% Comment: room air  BMI 23.16 kg/m    Today's Vitals   06/19/22 0856 06/19/22 0902  BP: (!) 130/96 (!) 137/101  Pulse: 77 85  Temp: 98.7 F (37.1 C)   TempSrc: Oral   SpO2: 98%   Weight: 166 lb (75.3 kg)   Height: 5' 10.98" (1.803 m)    Body mass index is 23.16 kg/m.   Physical Exam   General appearance: Well developed, well nourished male, cooperative and in no acute distress Head: Normocephalic, without obvious abnormality, atraumatic Respiratory: Respirations even and unlabored, normal respiratory rate. Faint expiratory wheezed posterior bases and anterior chest.  Extremities: All extremities are intact.  Skin: Skin color, texture, turgor normal. No rashes seen  Psych: Appropriate mood and affect. Neurologic: Mental status: Alert, oriented to person, place, and time, thought content appropriate.   Assessment & Plan     1. Bronchitis Mostly viral features at this point. Treat symptomatically as below  - azithromycin (ZITHROMAX) 250 MG tablet; Take 2 tablets on day 1, then 1 tablet daily on days 2 through 5  Dispense: 6 tablet to fill only if he does not continue to improvement or if any new sx c/w bacteriral infection develop.   2. Cough, unspecified type  - HYDROcodone bit-homatropine (HYCODAN) 5-1.5 MG/5ML syrup; Take 5 mLs by mouth every 8 (eight) hours as needed for cough.  Dispense: 120 mL; Refill: 0  3. Wheeze  -  albuterol (VENTOLIN HFA) 108 (90 Base) MCG/ACT inhaler; Inhale 2 puffs into the lungs every 6 (six) hours as needed for wheezing or shortness of breath.  Dispense: 8 g; Refill: 2      The entirety of the information documented in the History of Present Illness, Review of Systems and Physical Exam were personally obtained by me. Portions of this information were initially documented by the CMA and reviewed by me for thoroughness and accuracy.      Mila Merry, MD  [facility] [phone] (phone) [phone] (fax)  [facility]

## 2022-06-19 NOTE — Telephone Encounter (Signed)
  Chief Complaint: respiratory symptoms- negative COVID Symptoms: cough, congestion, wheezing - fever/body aches- resolved Frequency: 1 week Pertinent Negatives: Patient denies fever Disposition: [] ED /[] Urgent Care (no appt availability in office) / [x] Appointment(In office/virtual)/ []  [place] [facility]/ [] Home Care/ [] Refused Recommended Disposition /[] [place] [facility]/ []  Follow-up with PCP Additional Notes: Patient reports household illness- all negative COVID testing- using OTC without resolution of symptoms- 1 week. Requesting appointment   Reason for Disposition  [1] MILD difficulty breathing (e.g., minimal/no SOB at rest, SOB with walking, pulse <100) AND [2] still present when not coughing  Answer Assessment - Initial Assessment Questions 1. ONSET: "When did the cough begin?"      1 week 2. SEVERITY: "How bad is the cough today?"      Coughing during triage 3. SPUTUM: "Describe the color of your sputum" (none, dry cough; clear, white, yellow, green)     beige 4. HEMOPTYSIS: "Are you coughing up any blood?" If so ask: "How much?" (flecks, streaks, tablespoons, etc.)     Once saw blood tinge 5. DIFFICULTY BREATHING: "Are you having difficulty breathing?" If Yes, ask: "How bad is it?" (e.g., mild, moderate, severe)    - MILD: No SOB at rest, mild SOB with walking, speaks normally in sentences, can lie down, no retractions, pulse < 100.    - MODERATE: SOB at rest, SOB with minimal exertion and prefers to sit, cannot lie down flat, speaks in phrases, mild retractions, audible wheezing, pulse 100-120.    - SEVERE: Very SOB at rest, speaks in single words, struggling to breathe, sitting hunched forward, retractions, pulse > 120      mild 6. FEVER: "Do you have a fever?" If Yes, ask: "What is your temperature, how was it measured, and when did it start?"     Not any more 7. CARDIAC HISTORY: "Do you have any history of heart disease?" (e.g., heart attack, congestive heart  failure)      *No Answer* 8. LUNG HISTORY: "Do you have any history of lung disease?"  (e.g., pulmonary embolus, asthma, emphysema)     *No Answer* 9. PE RISK FACTORS: "Do you have a history of blood clots?" (or: recent major surgery, recent prolonged travel, bedridden)     *No Answer* 10. OTHER SYMPTOMS: "Do you have any other symptoms?" (e.g., runny nose, wheezing, chest pain)       *No Answer* 11. PREGNANCY: "Is there any chance you are pregnant?" "When was your last menstrual period?"       *No Answer* 12. TRAVEL: "Have you traveled out of the country in the last month?" (e.g., travel history, exposures)       *No Answer*  Protocols used: Cough - Acute Productive-A-AH

## 2022-10-05 ENCOUNTER — Telehealth (INDEPENDENT_AMBULATORY_CARE_PROVIDER_SITE_OTHER): Payer: 59 | Admitting: Family Medicine

## 2022-10-05 ENCOUNTER — Encounter: Payer: Self-pay | Admitting: Family Medicine

## 2022-10-05 ENCOUNTER — Ambulatory Visit: Payer: 59 | Admitting: Family Medicine

## 2022-10-05 DIAGNOSIS — H0011 Chalazion right upper eyelid: Secondary | ICD-10-CM | POA: Diagnosis not present

## 2022-10-05 MED ORDER — CEPHALEXIN 500 MG PO CAPS: 500.0000 mg | ORAL_CAPSULE | Freq: Three times a day (TID) | ORAL | 0 refills | Status: AC

## 2022-10-05 MED ORDER — CIPROFLOXACIN HCL 0.3 % OP SOLN: 2.0000 [drp] | OPHTHALMIC | 0 refills | Status: AC

## 2022-10-05 NOTE — Progress Notes (Addendum)
      MyChart Video Visit    Virtual Visit via Video Note   This format is felt to be most appropriate for this patient at this time. Physical exam was limited by quality of the video and audio technology used for the visit.   Patient location: home Provider location: [facility]  I discussed the limitations of evaluation and management by telemedicine and the availability of in person appointments. The patient expressed understanding and agreed to proceed.   Patient: Douglas Yang   DOB: 02-22-1976   47 y.o. Male  MRN: [id number] Visit Date: 10/05/2022  Today's healthcare provider: Lelon Huh, MD    Subjective    HPI HPI   Pt stated--right upper eye lid--swollen, redness, yellow discharge--9 days. Last edited by Elta Guadeloupe, CMA on 10/05/2022  1:50 PM.      Was dx with chalezium on video visit last week and advised to use warm compress. Has been getting worse since then. This morning it popped and started drain. Feels better, but is still red and swollen.   Medications: Outpatient Medications Prior to Visit  Medication Sig   albuterol (VENTOLIN HFA) 108 (90 Base) MCG/ACT inhaler Inhale 2 puffs into the lungs every 6 (six) hours as needed for wheezing or shortness of breath.   fluticasone (FLONASE) 50 MCG/ACT nasal spray Place 2 sprays into both nostrils daily.   Glucosamine HCl (GLUCOSAMINE PO) Take by mouth daily.   HYDROcodone bit-homatropine (HYCODAN) 5-1.5 MG/5ML syrup Take 5 mLs by mouth every 8 (eight) hours as needed for cough.   No facility-administered medications prior to visit.    Review of Systems  Constitutional:  Negative for appetite change, chills and fever.  Respiratory:  Negative for chest tightness, shortness of breath and wheezing.   Cardiovascular:  Negative for chest pain and palpitations.  Gastrointestinal:  Negative for abdominal pain, nausea and vomiting.       Objective    There were no vitals taken for this visit.   Physical Exam   Right upper eyelid red and swollen with small external chalezion and scant amount yellow discharge.  No redness of conjuctiva.    Assessment & Plan     1. Chalazion of right upper eyelid  - cephALEXin (KEFLEX) 500 MG capsule; Take 1 capsule (500 mg total) by mouth 3 (three) times daily for 7 days.  Dispense: 21 capsule; Refill: 0 - ciprofloxacin (CILOXAN) 0.3 % ophthalmic solution; Place 2 drops into the right eye every 4 (four) hours while awake for 7 days.  Dispense: 5 mL; Refill: 0   I discussed the assessment and treatment plan with the patient. The patient was provided an opportunity to ask questions and all were answered. The patient agreed with the plan and demonstrated an understanding of the instructions.   The patient was advised to call back or seek an in-person evaluation if the symptoms worsen or if the condition fails to improve as anticipated.  I provided 10 minutes of non-face-to-face time during this encounter.      Lelon Huh, MD  [place] [phone] (phone) [phone] (fax)  [place]

## 2023-03-09 ENCOUNTER — Encounter: Payer: Self-pay | Admitting: Physician Assistant

## 2023-03-09 ENCOUNTER — Ambulatory Visit: Payer: 59 | Admitting: Physician Assistant

## 2023-03-09 VITALS — BP 132/84 | HR 88 | Temp 98.0°F | Resp 16 | Ht 70.98 in | Wt 166.0 lb

## 2023-03-09 DIAGNOSIS — H1031 Unspecified acute conjunctivitis, right eye: Secondary | ICD-10-CM | POA: Diagnosis not present

## 2023-03-09 MED ORDER — ERYTHROMYCIN 5 MG/GM OP OINT
1.0000 | TOPICAL_OINTMENT | Freq: Four times a day (QID) | OPHTHALMIC | 0 refills | Status: AC
Start: 2023-03-09 — End: 2023-03-16

## 2023-03-09 NOTE — Patient Instructions (Addendum)
Based on your symptoms I believe that you have bacterial conjunctivitis  I have sent in a script for Erythromycin ophthalmic ointment - please apply a 1/2 inch strip of the ointment to your right eye every 6 hours for 7 days  You can use sterile eye flushes and lubricating eye drops to assist with eye irritation and further resolution You can also use a warm compress over the eye to assist with swelling and matting - especially in the morning  If you have used makeup or mascara on that eye I recommend discarding it as this can cause recurrent infection. Thoroughly wash any makeup brushes and avoid using makeup while recovering from the infection.  If you notice the following please return to the office: lack of improvement, eyelid swelling, increased eye irritation If you notice the following please go to the ED: eye pressure causing displacement of the eye, vision changes, increased eye pain or foreign body sensation, fever  I also recommend adding an antihistamine to your daily regimen This includes medications like Claritin, Allegra, Zyrtec- the generics of these work very well and are usually less expensive I recommend using Flonase nasal spray - 2 puffs twice per day to help with your nasal congestion The antihistamines and Flonase can take a few weeks to provide significant relief from allergy symptoms but should start to provide some benefit soon.

## 2023-03-09 NOTE — Progress Notes (Unsigned)
Acute Office Visit   Patient: Douglas Yang   DOB: Jun 12, 1976   46 y.o. Male  MRN: [id number] Visit Date: 03/09/2023  Today's healthcare provider: Oswaldo Conroy Dezerae Freiberger, PA-C  Introduced myself to the patient as a Secondary school teacher and provided education on APPs in clinical practice.    Chief Complaint  Patient presents with   Eye Problem    Started Sun evening pt mowed grass unsure if related but Mild irritated, also has stye on right eye   Subjective    HPI HPI     Eye Problem    Additional comments: Started Sun evening pt mowed grass unsure if related but Mild irritated, also has stye on right eye      Last edited by Forde Radon, CMA on 03/09/2023  9:24 AM.       Eye concern- concern for pink eye in right eye  Onset: sudden  Duration: started Sun  He does not think he got anything in his eye  He reports having some eye issues for the past month States while he was mowing grass over the weekend (Sun), his right eye was getting very watery and teary  He reports having to get up frequently in the night to clean drainage Associated symptoms: watery eyes, redness, mild itching, matting in the AM, ocular swelling He denies foreign body sensation, grittiness Interventions: warm compresses, has used some eye drops (antihistamine eye drops?), generic lubricating eye drops        Medications: Outpatient Medications Prior to Visit  Medication Sig   albuterol (VENTOLIN HFA) 108 (90 Base) MCG/ACT inhaler Inhale 2 puffs into the lungs every 6 (six) hours as needed for wheezing or shortness of breath. (Patient not taking: Reported on 03/09/2023)   fluticasone (FLONASE) 50 MCG/ACT nasal spray Place 2 sprays into both nostrils daily. (Patient not taking: Reported on 03/09/2023)   Glucosamine HCl (GLUCOSAMINE PO) Take by mouth daily. (Patient not taking: Reported on 03/09/2023)   HYDROcodone bit-homatropine (HYCODAN) 5-1.5 MG/5ML syrup Take 5 mLs by mouth every 8 (eight)  hours as needed for cough. (Patient not taking: Reported on 03/09/2023)   No facility-administered medications prior to visit.    Review of Systems  Eyes:  Positive for discharge and redness. Negative for photophobia, pain, itching and visual disturbance.    {Labs  Heme  Chem  Endocrine  Serology  Results Review (optional):23779}   Objective    BP 132/84   Pulse 88   Temp 98 F (36.7 C) (Oral)   Resp 16   Ht 5' 10.98" (1.803 m)   Wt 166 lb (75.3 kg)   SpO2 99%   BMI 23.17 kg/m  {Show previous vital signs (optional):23777}  Physical Exam Vitals reviewed.  Constitutional:      General: He is awake.     Appearance: Normal appearance. He is well-developed and well-groomed.  HENT:     Head: Normocephalic and atraumatic.  Eyes:     General: Gaze aligned appropriately. No allergic shiner.       Right eye: Discharge and hordeolum present.     Extraocular Movements: Extraocular movements intact.     Right eye: Normal extraocular motion and no nystagmus.     Left eye: Normal extraocular motion and no nystagmus.     Conjunctiva/sclera:     Right eye: Right conjunctiva is injected.     Pupils: Pupils are equal, round, and reactive to light.  Musculoskeletal:     Cervical  back: Normal range of motion.  Neurological:     Mental Status: He is alert.  Psychiatric:        Behavior: Behavior is cooperative.       No results found for any visits on 03/09/23.  Assessment & Plan      No follow-ups on file.      Problem List Items Addressed This Visit   None Visit Diagnoses     Acute bacterial conjunctivitis of right eye    -  Primary   Relevant Medications   erythromycin ophthalmic ointment        No follow-ups on file.   I, Camara Renstrom E Gar Glance, PA-C, have reviewed all documentation for this visit. The documentation on 03/09/23 for the exam, diagnosis, procedures, and orders are all accurate and complete.   Jacquelin Hawking, MHS, PA-C [facility]

## 2023-09-25 ENCOUNTER — Telehealth: Payer: Self-pay | Admitting: Physician Assistant

## 2023-09-25 DIAGNOSIS — J019 Acute sinusitis, unspecified: Secondary | ICD-10-CM | POA: Diagnosis not present

## 2023-09-25 MED ORDER — AMOXICILLIN-POT CLAVULANATE 875-125 MG PO TABS: 1.0000 | ORAL_TABLET | Freq: Two times a day (BID) | ORAL | 0 refills | Status: AC

## 2023-09-25 MED ORDER — PROMETHAZINE-DM 6.25-15 MG/5ML PO SYRP: 5.0000 mL | ORAL_SOLUTION | Freq: Four times a day (QID) | ORAL | 0 refills | Status: AC | PRN

## 2023-09-25 NOTE — Progress Notes (Signed)
 Virtual Visit Consent   Douglas Yang, you are scheduled for a virtual visit with a [facility] provider today. Just as with appointments in the office, your consent must be obtained to participate. Your consent will be active for this visit and any virtual visit you may have with one of our providers in the next 365 days. If you have a MyChart account, a copy of this consent can be sent to you electronically.  As this is a virtual visit, video technology does not allow for your provider to perform a traditional examination. This may limit your provider's ability to fully assess your condition. If your provider identifies any concerns that need to be evaluated in person or the need to arrange testing (such as labs, EKG, etc.), we will make arrangements to do so. Although advances in technology are sophisticated, we cannot ensure that it will always work on either your end or our end. If the connection with a video visit is poor, the visit may have to be switched to a telephone visit. With either a video or telephone visit, we are not always able to ensure that we have a secure connection.  By engaging in this virtual visit, you consent to the provision of healthcare and authorize for your insurance to be billed (if applicable) for the services provided during this visit. Depending on your insurance coverage, you may receive a charge related to this service.  I need to obtain your verbal consent now. Are you willing to proceed with your visit today? CORDARRO SPINNATO has provided verbal consent on 10/23/2023 for a virtual visit (video or telephone). Harlene PEDLAR Ward, PA-C  Date: 10/23/2023 2:29 PM  Virtual Visit via Video Note   I, Harlene PEDLAR Ward, connected with  Douglas Yang  ([id number], Jun 04, 1976) on 10/23/23 at 11:30 AM EST by a video-enabled telemedicine application and verified that I am speaking with the correct person using two identifiers.  Location: Patient: Virtual Visit  Location Patient: Home Provider: Virtual Visit Location Provider: Home Office   I discussed the limitations of evaluation and management by telemedicine and the availability of in person appointments. The patient expressed understanding and agreed to proceed.    History of Present Illness: Douglas Yang is a 48 y.o. who identifies as a male who was assigned male at birth, and is being seen today for cough and sinus pressure. Reports slowly improving, but over the last few days sx have gotten worse.  He denies fevers, wheezing, shortness of breath. Reports chills that started over the last few days.  Currently he is taking otc ibuprofen and otc cough syrup.   HPI: HPI  Problems:  Patient Active Problem List   Diagnosis Date Noted   Right upper quadrant pain 11/05/2015   Anxiety 11/04/2015   Insomnia 11/04/2015   Situational stress 11/04/2015    Allergies: No Known Allergies Medications:  Current Outpatient Medications:    amoxicillin -clavulanate (AUGMENTIN ) 875-125 MG tablet, Take 1 tablet by mouth 2 (two) times daily., Disp: 20 tablet, Rfl: 0   promethazine -dextromethorphan (PROMETHAZINE -DM) 6.25-15 MG/5ML syrup, Take 5 mLs by mouth 4 (four) times daily as needed for cough., Disp: 118 mL, Rfl: 0   albuterol  (VENTOLIN  HFA) 108 (90 Base) MCG/ACT inhaler, Inhale 2 puffs into the lungs every 6 (six) hours as needed for wheezing or shortness of breath. (Patient not taking: Reported on 03/09/2023), Disp: 8 g, Rfl: 2   fluticasone  (FLONASE ) 50 MCG/ACT nasal spray, Place 2 sprays into both nostrils  daily. (Patient not taking: Reported on 03/09/2023), Disp: 16 g, Rfl: 6   Glucosamine HCl (GLUCOSAMINE PO), Take by mouth daily. (Patient not taking: Reported on 03/09/2023), Disp: , Rfl:   Observations/Objective: Patient is well-developed, well-nourished in no acute distress.  Resting comfortably at home.  Head is normocephalic, atraumatic.  No labored breathing.  Speech is clear and coherent  with logical content.  Patient is alert and oriented at baseline.    Assessment and Plan: 1. Acute non-recurrent sinusitis, unspecified location (Primary)  Antibiotic prescribed.  Cough syrup prescribed.  In person evaluation precaution discussed.   Follow Up Instructions: I discussed the assessment and treatment plan with the patient. The patient was provided an opportunity to ask questions and all were answered. The patient agreed with the plan and demonstrated an understanding of the instructions.  A copy of instructions were sent to the patient via MyChart unless otherwise noted below.     The patient was advised to call back or seek an in-person evaluation if the symptoms worsen or if the condition fails to improve as anticipated.    Harlene PEDLAR Ward, PA-C

## 2023-10-23 ENCOUNTER — Encounter: Payer: Self-pay | Admitting: Physician Assistant

## 2024-03-20 ENCOUNTER — Ambulatory Visit: Payer: Self-pay

## 2024-03-20 ENCOUNTER — Ambulatory Visit
Admission: EM | Admit: 2024-03-20 | Discharge: 2024-03-20 | Disposition: A | Discharge status: Home / Self Care | Attending: Emergency Medicine | Admitting: Emergency Medicine

## 2024-03-20 DIAGNOSIS — Z23 Encounter for immunization: Secondary | ICD-10-CM | POA: Diagnosis not present

## 2024-03-20 DIAGNOSIS — S70362A Insect bite (nonvenomous), left thigh, initial encounter: Secondary | ICD-10-CM | POA: Diagnosis not present

## 2024-03-20 DIAGNOSIS — R5383 Other fatigue: Secondary | ICD-10-CM | POA: Diagnosis not present

## 2024-03-20 DIAGNOSIS — M791 Myalgia, unspecified site: Secondary | ICD-10-CM | POA: Diagnosis not present

## 2024-03-20 DIAGNOSIS — W57XXXA Bitten or stung by nonvenomous insect and other nonvenomous arthropods, initial encounter: Secondary | ICD-10-CM | POA: Diagnosis not present

## 2024-03-20 DIAGNOSIS — J069 Acute upper respiratory infection, unspecified: Secondary | ICD-10-CM

## 2024-03-20 MED ORDER — TETANUS-DIPHTH-ACELL PERTUSSIS 5-2.5-18.5 LF-MCG/0.5 IM SUSY
0.5000 mL | PREFILLED_SYRINGE | Freq: Once | INTRAMUSCULAR | Status: AC
  Administered 2024-03-20: 0.5 mL via INTRAMUSCULAR

## 2024-03-20 MED ORDER — DOXYCYCLINE HYCLATE 100 MG PO CAPS: 100.0000 mg | ORAL_CAPSULE | Freq: Two times a day (BID) | ORAL | 0 refills | Status: AC

## 2024-03-20 NOTE — Discharge Instructions (Addendum)
 Follow up with your primary care provider tomorrow.  Go to the emergency department if you have worsening symptoms.    Take the doxycycline  as directed.

## 2024-03-20 NOTE — ED Triage Notes (Addendum)
 Patient to Urgent Care with complaints of joint stiffness and extremity pain/ fevers/ night sweats/ loss appetite/ fatigue. Reports some minor respiratory symptoms w/ nasal congestion and scratchy throat over the last few days.   Symptoms started one week ago. Worsened Thursday. States he pulled off a tick approx one month ago. Reports he also cut himself on a piece of metal prior to symptoms starting.   Taking tylenol/ ibuprofen.

## 2024-03-20 NOTE — Telephone Encounter (Signed)
 Triage Disposition: See HCP Within 4 Hours (Or PCP Triage)  Patient/caregiver understands and will follow disposition?: YesFYI Only or Action Required?: FYI only for provider.  Patient was last seen in primary care on 10/05/2022 by Gasper Nancyann BRAVO, MD. Called Nurse Triage reporting Fatigue. Symptoms began a week ago. Interventions attempted: Nothing. Symptoms are: Muscle pain, fatigue, some minor sinus congestion. Recent tick bite and skin puncture with metal. Tetanus is not up to date.gradually worsening.  Triage Disposition: See HCP Within 4 Hours (Or PCP Triage) - Pt has appt at [facility] for this evening at 7pm.   Patient/caregiver understands and will follow disposition?: Yes                  Copied from CRM [id number]. Topic: Clinical - Red Word Triage >> Mar 20, 2024 10:55 AM Elle L wrote: Red Word that prompted transfer to Nurse Triage: The patient states 7-8 days ago he started feeling extreme fatigue and has developed muscle aches in his arms, legs, and neck. He has had an occasional sore throat and nasal drainage at night. He also has chills and a low grade fever. It subsided Wednesday but worsened on Thursday evening. Reason for Disposition  [1] MODERATE weakness (i.e., interferes with work, school, normal activities) AND [2] cause unknown  (Exceptions: Weakness from acute minor illness or poor fluid intake; weakness is chronic and not worse.)  Answer Assessment - Initial Assessment Questions 1. DESCRIPTION: Describe how you are feeling.     Fatigue - muscle aches - biceps and leg muscles - hand pain 2. SEVERITY: How bad is it?  Can you stand and walk?   - MILD (0-3): Feels weak or tired, but does not interfere with work, school or normal activities.   - MODERATE (4-7): Able to stand and walk; weakness interferes with work, school, or normal activities.   - SEVERE (8-10): Unable to stand or walk; unable to do usual activities.     moderate 3. ONSET: When did these  symptoms begin? (e.g., hours, days, weeks, months)     7-8  days 4. CAUSE: What do you think is causing the weakness or fatigue? (e.g., not drinking enough fluids, medical problem, trouble sleeping)     unsure 5. NEW MEDICINES:  Have you started on any new medicines recently? (e.g., opioid pain medicines, benzodiazepines, muscle relaxants, antidepressants, antihistamines, neuroleptics, beta blockers)     no 6. OTHER SYMPTOMS: Do you have any other symptoms? (e.g., chest pain, fever, cough, SOB, vomiting, diarrhea, bleeding, other areas of pain)     Mild congestion - sinus drainage - chills have resolved  Protocols used: Weakness (Generalized) and Fatigue-A-AH

## 2024-03-20 NOTE — ED Provider Notes (Signed)
 Douglas Yang    CSN: [id number] Arrival date & time: 03/20/24  1850      History   Chief Complaint Chief Complaint  Patient presents with   Fatigue   Joint Pain    HPI Douglas Yang is a 48 y.o. male.  Patient presents with generalized myalgias, chills, subjective fever, fatigue, headache, decreased appetite x 1 week.  He also reports congestion, postnasal drip, scratchy throat, cough x 4 days.  No rash, shortness of breath, jaw stiffness, nausea, vomiting, diarrhea, numbness, weakness.  He took ibuprofen at 0700 this morning.  Patient reports he had a tick bite on his left thigh approximately 5 weeks ago.  He also had a from a piece of metal on his left forearm approximately 10 days ago.  He states his last tetanus was approximately 10 years ago.  The history is provided by the patient and medical records.    Past Medical History:  Diagnosis Date   Anxiety    Insomnia     Patient Active Problem List   Diagnosis Date Noted   Right upper quadrant pain 11/05/2015   Anxiety 11/04/2015   Insomnia 11/04/2015   Situational stress 11/04/2015    Past Surgical History:  Procedure Laterality Date   Right arm Surgery     broken arm       Home Medications    Prior to Admission medications   Medication Sig Start Date End Date Taking? Authorizing Provider  doxycycline  (VIBRAMYCIN ) 100 MG capsule Take 1 capsule (100 mg total) by mouth 2 (two) times daily. 03/20/24  Yes Corlis Burnard DEL, NP  albuterol  (VENTOLIN  HFA) 108 (90 Base) MCG/ACT inhaler Inhale 2 puffs into the lungs every 6 (six) hours as needed for wheezing or shortness of breath. Patient not taking: Reported on 03/09/2023 06/19/22   Gasper Nancyann BRAVO, MD  amoxicillin -clavulanate (AUGMENTIN ) 875-125 MG tablet Take 1 tablet by mouth 2 (two) times daily. Patient not taking: Reported on 03/20/2024 09/25/23   Ward, Harlene PEDLAR, PA-C  fluticasone  (FLONASE ) 50 MCG/ACT nasal spray Place 2 sprays into both nostrils  daily. Patient not taking: Reported on 03/09/2023 11/03/17   Gasper Nancyann BRAVO, MD  Glucosamine HCl (GLUCOSAMINE PO) Take by mouth daily. Patient not taking: Reported on 03/09/2023    [provider]  promethazine -dextromethorphan (PROMETHAZINE -DM) 6.25-15 MG/5ML syrup Take 5 mLs by mouth 4 (four) times daily as needed for cough. Patient not taking: Reported on 03/20/2024 09/25/23   Ward, Harlene PEDLAR, PA-C    Family History Family History  Problem Relation Age of Onset   Osteoporosis Mother    Heart failure Father     Social History Social History   Tobacco Use   Smoking status: Never   Smokeless tobacco: Never  Substance Use Topics   Alcohol use: Yes    Alcohol/week: 0.0 standard drinks of alcohol    Comment: occasional use   Drug use: No     Allergies   Patient has no known allergies.   Review of Systems Review of Systems  Constitutional:  Positive for appetite change, chills, fatigue and fever.  HENT:  Positive for congestion, postnasal drip and sore throat. Negative for ear pain.   Respiratory:  Positive for cough. Negative for shortness of breath.   Cardiovascular:  Negative for chest pain and palpitations.  Gastrointestinal:  Negative for abdominal pain, diarrhea and vomiting.  Musculoskeletal:  Positive for arthralgias and myalgias.  Skin:  Negative for color change and rash.  Neurological:  Positive for  headaches. Negative for weakness and numbness.     Physical Exam Triage Vital Signs ED Triage Vitals [03/20/24 1913]  Encounter Vitals Group     BP (!) 143/90     Girls Systolic BP Percentile      Girls Diastolic BP Percentile      Boys Systolic BP Percentile      Boys Diastolic BP Percentile      Pulse Rate 94     Resp 18     Temp 98.6 F (37 C)     Temp src      SpO2 97 %     Weight      Height      Head Circumference      Peak Flow      Pain Score      Pain Loc      Pain Education      Exclude from Growth Chart    No data  found.  Updated Vital Signs BP (!) 143/90   Pulse 94   Temp 98.6 F (37 C)   Resp 18   SpO2 97%   Visual Acuity Right Eye Distance:   Left Eye Distance:   Bilateral Distance:    Right Eye Near:   Left Eye Near:    Bilateral Near:     Physical Exam Constitutional:      General: He is not in acute distress. HENT:     Right Ear: Tympanic membrane normal.     Left Ear: Tympanic membrane normal.     Nose: Nose normal.     Mouth/Throat:     Mouth: Mucous membranes are moist.     Pharynx: Oropharynx is clear.  Cardiovascular:     Rate and Rhythm: Normal rate and regular rhythm.     Heart sounds: Normal heart sounds.  Pulmonary:     Effort: Pulmonary effort is normal. No respiratory distress.     Breath sounds: Normal breath sounds.  Musculoskeletal:        General: No swelling, tenderness or deformity. Normal range of motion.  Skin:    General: Skin is warm and dry.     Capillary Refill: Capillary refill takes less than 2 seconds.     Findings: No bruising, erythema, lesion or rash.  Neurological:     General: No focal deficit present.     Mental Status: He is alert.     Sensory: No sensory deficit.     Motor: No weakness.     Gait: Gait normal.      UC Treatments / Results  Labs (all labs ordered are listed, but only abnormal results are displayed) Labs Reviewed  LYME DISEASE SEROLOGY W/REFLEX  SPOTTED FEVER GROUP ANTIBODIES    EKG   Radiology No results found.  Procedures Procedures (including critical care time)  Medications Ordered in UC Medications  Tdap (BOOSTRIX ) injection 0.5 mL (0.5 mLs Intramuscular Given 03/20/24 1957)    Initial Impression / Assessment and Plan / UC Course  I have reviewed the triage vital signs and the nursing notes.  Pertinent labs & imaging results that were available during my care of the patient were reviewed by me and considered in my medical decision making (see chart for details).   Myalgias, fatigue, tick bite  of left thigh, acute URI.  Afebrile and vital signs are stable.  Discussed limitations of evaluation in an urgent care setting.  Patient has mild anxiety for needlesticks.  He agrees to blood work for Lyme and  RMSF.  Tetanus updated today.  Treating today with 10-day course of doxycycline  given his symptoms and history of tick bite approximately 5 weeks ago.  Instructed him to follow-up with his PCP tomorrow.  ED precautions given.  Education provided on tick bites.  Patient agrees to plan of care.   Final Clinical Impressions(s) / UC Diagnoses   Final diagnoses:  Myalgia  Fatigue, unspecified type  Tick bite of left thigh, initial encounter  Acute upper respiratory infection     Discharge Instructions      Follow up with your primary care provider tomorrow.  Go to the emergency department if you have worsening symptoms.    Take the doxycycline  as directed.       ED Prescriptions     Medication Sig Dispense Auth. Provider   doxycycline  (VIBRAMYCIN ) 100 MG capsule Take 1 capsule (100 mg total) by mouth 2 (two) times daily. 20 capsule Corlis Burnard DEL, NP      PDMP not reviewed this encounter.   Corlis Burnard DEL, NP 03/20/24 1958

## 2024-03-22 ENCOUNTER — Ambulatory Visit: Admitting: Family Medicine

## 2024-03-23 LAB — LYME DISEASE SEROLOGY W/REFLEX: Lyme Total Antibody EIA: NEGATIVE

## 2024-03-23 LAB — SPOTTED FEVER GROUP ANTIBODIES
Spotted Fever Group IgG: <1:64 {titer}
Spotted Fever Group IgM: <1:64 {titer}
